# Patient Record
Sex: Male | Born: 1975 | Race: White | Hispanic: No | Marital: Married | Smoking: Never smoker
Health system: Southern US, Community
[De-identification: ages and names within clinical notes are randomized; demographics above are authoritative.]

---

## 2020-07-16 ENCOUNTER — Emergency Department (HOSPITAL_COMMUNITY)
Admission: EM | Admit: 2020-07-16 | Discharge: 2020-07-16 | Disposition: A | Discharge status: Home / Self Care | Payer: 59 | Attending: Emergency Medicine | Admitting: Emergency Medicine

## 2020-07-16 ENCOUNTER — Encounter (HOSPITAL_COMMUNITY): Payer: Self-pay | Admitting: Emergency Medicine

## 2020-07-16 ENCOUNTER — Emergency Department (HOSPITAL_COMMUNITY): Payer: 59

## 2020-07-16 ENCOUNTER — Other Ambulatory Visit: Payer: Self-pay

## 2020-07-16 DIAGNOSIS — R072 Precordial pain: Secondary | ICD-10-CM | POA: Insufficient documentation

## 2020-07-16 DIAGNOSIS — R0789 Other chest pain: Secondary | ICD-10-CM

## 2020-07-16 LAB — COMPREHENSIVE METABOLIC PANEL
ALT: 15 U/L (ref 0–44)
AST: 20 U/L (ref 15–41)
Albumin: 4.6 g/dL (ref 3.5–5.0)
Alkaline Phosphatase: 55 U/L (ref 38–126)
Anion gap: 5 (ref 5–15)
BUN: 12 mg/dL (ref 6–20)
CO2: 26 mmol/L (ref 22–32)
Calcium: 9.4 mg/dL (ref 8.9–10.3)
Chloride: 106 mmol/L (ref 98–111)
Creatinine, Ser: 1.09 mg/dL (ref 0.61–1.24)
GFR, Estimated: >60 mL/min (ref >60)
Glucose, Bld: 109 mg/dL — ABNORMAL HIGH (ref 70–99)
Potassium: 3.8 mmol/L (ref 3.5–5.1)
Sodium: 137 mmol/L (ref 135–145)
Total Bilirubin: 1.7 mg/dL — ABNORMAL HIGH (ref 0.3–1.2)
Total Protein: 7.8 g/dL (ref 6.5–8.1)

## 2020-07-16 LAB — CBC WITH DIFFERENTIAL/PLATELET
Abs Immature Granulocytes: 0.03 10*3/uL (ref 0.00–0.07)
Basophils Absolute: 0 10*3/uL (ref 0.0–0.1)
Basophils Relative: 1 %
Eosinophils Absolute: 0.1 10*3/uL (ref 0.0–0.5)
Eosinophils Relative: 1 %
HCT: 44.8 % (ref 39.0–52.0)
Hemoglobin: 15.2 g/dL (ref 13.0–17.0)
Immature Granulocytes: 0 %
Lymphocytes Relative: 26 %
Lymphs Abs: 1.8 10*3/uL (ref 0.7–4.0)
MCH: 29.5 pg (ref 26.0–34.0)
MCHC: 33.9 g/dL (ref 30.0–36.0)
MCV: 86.8 fL (ref 80.0–100.0)
Monocytes Absolute: 0.6 10*3/uL (ref 0.1–1.0)
Monocytes Relative: 8 %
Neutro Abs: 4.4 10*3/uL (ref 1.7–7.7)
Neutrophils Relative %: 64 %
Platelets: 253 10*3/uL (ref 150–400)
RBC: 5.16 MIL/uL (ref 4.22–5.81)
RDW: 12.4 % (ref 11.5–15.5)
WBC: 6.8 10*3/uL (ref 4.0–10.5)
nRBC: 0 % (ref 0.0–0.2)

## 2020-07-16 LAB — TROPONIN I (HIGH SENSITIVITY)
Troponin I (High Sensitivity): 2 ng/L (ref <18)
Troponin I (High Sensitivity): <2 ng/L (ref <18)

## 2020-07-16 NOTE — Discharge Instructions (Addendum)
Follow-up with your family doctor in the next month or 2 to get your liver enzymes checked again.  Follow-up with one of the cardiologists over the next couple weeks return if any problems.  If you have a very fast heart rate that does not go back down to normal you need to come to the hospital

## 2020-07-16 NOTE — ED Triage Notes (Signed)
Pt c/o left sided chest tightness that radiates to left shoulder and arm for a couple weeks. Noticed last night resting HR went up to 180s when normal around 70.

## 2020-07-16 NOTE — ED Provider Notes (Signed)
[facility]-EMERGENCY DEPT Provider Note   CSN: [id number] Arrival date & time: 07/16/20  0800     History Chief Complaint  Patient presents with  . Chest Pain    Caleb Cole is a 45 y.o. male.  Patient states that he has been having some chest discomfort off and on for a few days.  But there is no exertional part to it.  And respirations do not affect.  The history is provided by the patient and medical records. No language interpreter was used.  Chest Pain Pain location:  Substernal area Pain quality: aching   Pain radiates to:  Does not radiate Pain severity:  Moderate Onset quality:  Sudden Timing:  Intermittent Progression:  Waxing and waning Chronicity:  New Associated symptoms: no abdominal pain, no back pain, no cough, no fatigue and no headache        History reviewed. No pertinent past medical history.  There are no problems to display for this patient.   History reviewed. No pertinent surgical history.     No family history on file.  Social History   Tobacco Use  . Smoking status: Never Smoker  . Smokeless tobacco: Never Used  Substance Use Topics  . Alcohol use: Yes    Home Medications Prior to Admission medications   Medication Sig Start Date End Date Taking? Authorizing Provider  amphetamine-dextroamphetamine (ADDERALL) 10 MG tablet Take 10 mg by mouth daily with breakfast.   Yes [provider]  escitalopram (LEXAPRO) 10 MG tablet Take 10 mg by mouth at bedtime.   Yes [provider]    Allergies    Patient has no known allergies.  Review of Systems   Review of Systems  Constitutional: Negative for appetite change and fatigue.  HENT: Negative for congestion, ear discharge and sinus pressure.   Eyes: Negative for discharge.  Respiratory: Negative for cough.   Cardiovascular: Positive for chest pain.  Gastrointestinal: Negative for abdominal pain and diarrhea.  Genitourinary: Negative  for frequency and hematuria.  Musculoskeletal: Negative for back pain.  Skin: Negative for rash.  Neurological: Negative for seizures and headaches.  Psychiatric/Behavioral: Negative for hallucinations.    Physical Exam Updated Vital Signs BP 131/83   Pulse 73   Temp 97.9 F (36.6 C) (Oral)   Resp 11   SpO2 99%   Physical Exam Vitals and nursing note reviewed.  Constitutional:      Appearance: He is well-developed.  HENT:     Head: Normocephalic.     Nose: Nose normal.  Eyes:     General: No scleral icterus.    Conjunctiva/sclera: Conjunctivae normal.  Neck:     Thyroid: No thyromegaly.  Cardiovascular:     Rate and Rhythm: Normal rate and regular rhythm.     Heart sounds: No murmur heard. No friction rub. No gallop.   Pulmonary:     Breath sounds: No stridor. No wheezing or rales.  Chest:     Chest wall: No tenderness.  Abdominal:     General: There is no distension.     Tenderness: There is no abdominal tenderness. There is no rebound.  Musculoskeletal:        General: Normal range of motion.     Cervical back: Neck supple.  Lymphadenopathy:     Cervical: No cervical adenopathy.  Skin:    Findings: No erythema or rash.  Neurological:     Mental Status: He is alert and oriented to person, place, and time.  Motor: No abnormal muscle tone.     Coordination: Coordination normal.  Psychiatric:        Behavior: Behavior normal.     ED Results / Procedures / Treatments   Labs (all labs ordered are listed, but only abnormal results are displayed) Labs Reviewed  COMPREHENSIVE METABOLIC PANEL - Abnormal; Notable for the following components:      Result Value   Glucose, Bld 109 (*)    Total Bilirubin 1.7 (*)    All other components within normal limits  CBC WITH DIFFERENTIAL/PLATELET  TROPONIN I (HIGH SENSITIVITY)  TROPONIN I (HIGH SENSITIVITY)    EKG None  Radiology DG Chest Port 1 View  Result Date: 07/16/2020 CLINICAL DATA:  Left side chest  tightness EXAM: PORTABLE CHEST 1 VIEW COMPARISON:  None. FINDINGS: Heart and mediastinal contours are within normal limits. No focal opacities or effusions. No acute bony abnormality. IMPRESSION: No active disease. Electronically Signed   By: Charlett Nose M.D.   On: 07/16/2020 08:51    Procedures Procedures   Medications Ordered in ED Medications - No data to display  ED Course  I have reviewed the triage vital signs and the nursing notes.  Pertinent labs & imaging results that were available during my care of the patient were reviewed by me and considered in my medical decision making (see chart for details). Labs unremarkable except for minimal elevation of T bili.  Doubt coronary disease.  We will refer him to cardiologist because he had that rapid heart rate at some point.  And he is referred to a family doctor to repeat his liver studies in a couple months   MDM Rules/Calculators/A&P                          Patient with normal labs noncardiac chest discomfort.  Patient will follow up with cardiology and family doctor Final Clinical Impression(s) / ED Diagnoses Final diagnoses:  None    Rx / DC Orders ED Discharge Orders    None       Bethann Berkshire, MD 07/16/20 1231

## 2022-07-30 IMAGING — DX DG CHEST 1V PORT
2 series · 2 of 2 positions shown · non-contrast
Comparison: None.

CLINICAL DATA: Left side chest tightness

EXAM:
PORTABLE CHEST 1 VIEW

[chest ap (1 of 2)]
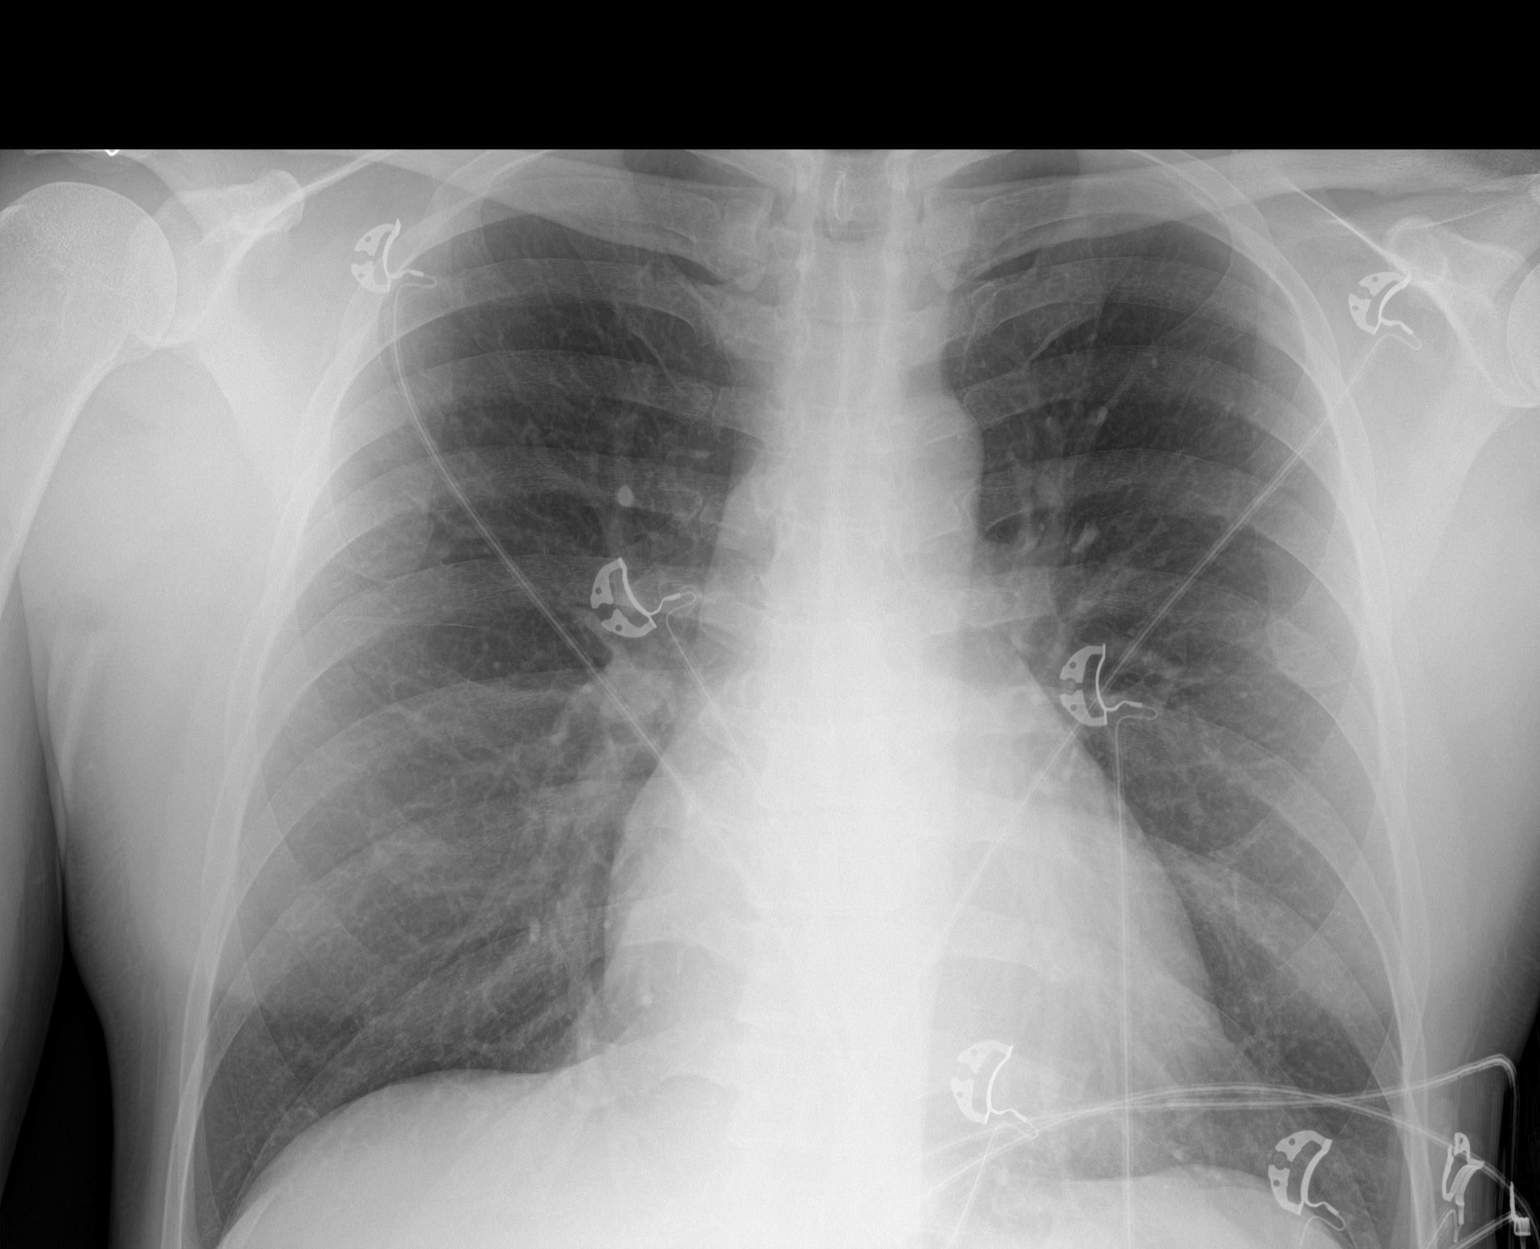

[chest ap (2 of 2)]
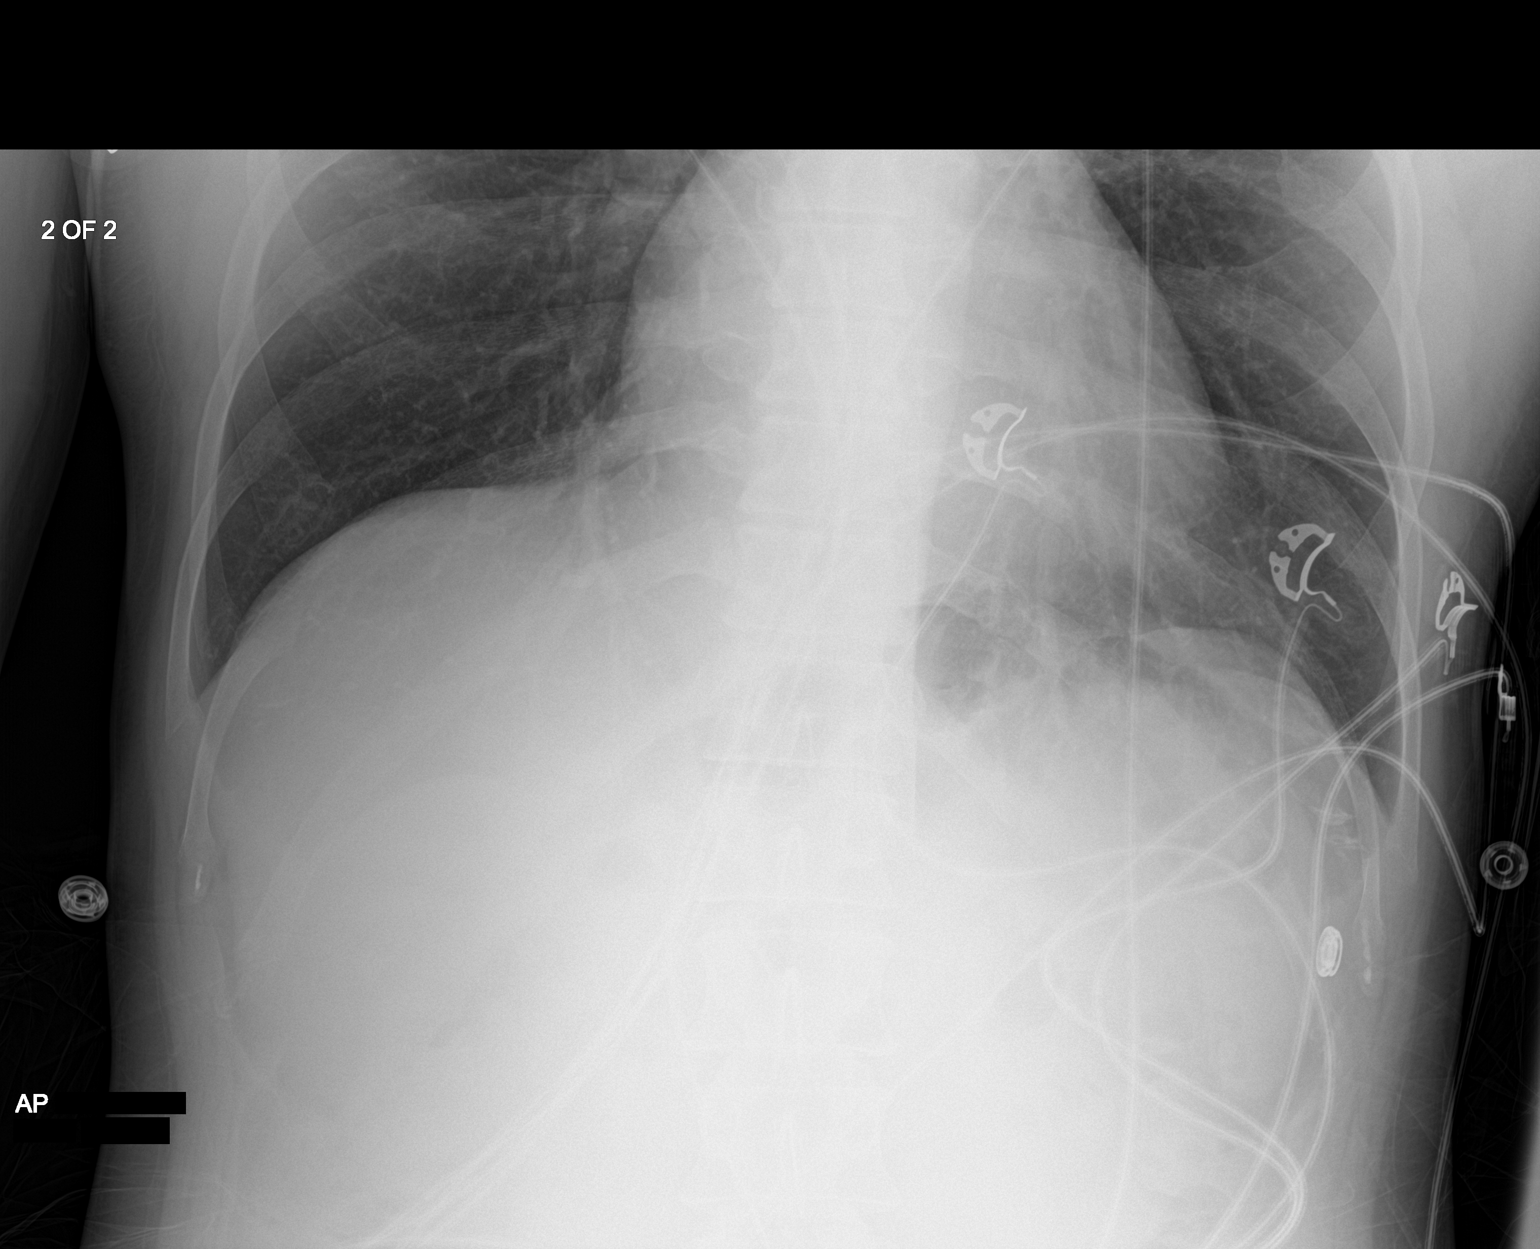

[2 of 2 positions shown; findings below may reference images not displayed]

FINDINGS: Heart and mediastinal contours are within normal limits. No focal
opacities or effusions. No acute bony abnormality.
IMPRESSION: No active disease.
